# Patient Record
Sex: Female | Born: 1975 | Race: Black or African American | Hispanic: No | Marital: Single
Health system: Southern US, Community
[De-identification: ages and names within clinical notes are randomized; demographics above are authoritative.]

---

## 2011-02-25 ENCOUNTER — Emergency Department: Payer: Self-pay | Admitting: Emergency Medicine

## 2011-04-29 ENCOUNTER — Emergency Department: Payer: Self-pay | Admitting: *Deleted

## 2011-05-10 ENCOUNTER — Emergency Department: Payer: Self-pay | Admitting: Emergency Medicine

## 2011-09-05 ENCOUNTER — Emergency Department: Payer: Self-pay | Admitting: Emergency Medicine

## 2011-09-05 LAB — CBC
HGB: 13.7 g/dL (ref 12.0–16.0)
MCH: 30 pg (ref 26.0–34.0)
MCHC: 33.8 g/dL (ref 32.0–36.0)
Platelet: 236 10*3/uL (ref 150–440)
RBC: 4.57 10*6/uL (ref 3.80–5.20)
RDW: 13.8 % (ref 11.5–14.5)

## 2011-09-05 LAB — COMPREHENSIVE METABOLIC PANEL
Alkaline Phosphatase: 66 U/L (ref 50–136)
BUN: 13 mg/dL (ref 7–18)
Bilirubin,Total: 0.3 mg/dL (ref 0.2–1.0)
Chloride: 106 mmol/L (ref 98–107)
Creatinine: 0.82 mg/dL (ref 0.60–1.30)
EGFR (African American): >60
EGFR (Non-African Amer.): >60
SGOT(AST): 21 U/L (ref 15–37)
SGPT (ALT): 26 U/L
Sodium: 143 mmol/L (ref 136–145)
Total Protein: 7.6 g/dL (ref 6.4–8.2)

## 2011-09-05 LAB — TROPONIN I: Troponin-I: <0.02 ng/mL

## 2012-01-24 ENCOUNTER — Ambulatory Visit: Payer: Self-pay | Admitting: Obstetrics and Gynecology

## 2012-01-24 LAB — BASIC METABOLIC PANEL
BUN: 9 mg/dL (ref 7–18)
Calcium, Total: 8.9 mg/dL (ref 8.5–10.1)
Chloride: 107 mmol/L (ref 98–107)
Co2: 28 mmol/L (ref 21–32)
Creatinine: 0.7 mg/dL (ref 0.60–1.30)
EGFR (African American): >60
EGFR (Non-African Amer.): >60
Glucose: 64 mg/dL — ABNORMAL LOW (ref 65–99)
Potassium: 3.9 mmol/L (ref 3.5–5.1)
Sodium: 142 mmol/L (ref 136–145)

## 2012-01-24 LAB — CBC
HCT: 38.5 % (ref 35.0–47.0)
MCV: 90 fL (ref 80–100)
RDW: 14.7 % — ABNORMAL HIGH (ref 11.5–14.5)

## 2012-02-01 ENCOUNTER — Ambulatory Visit: Payer: Self-pay | Admitting: Obstetrics and Gynecology

## 2012-02-02 LAB — HEMATOCRIT: HCT: 31.7 % — ABNORMAL LOW (ref 35.0–47.0)

## 2012-02-07 ENCOUNTER — Emergency Department: Payer: Self-pay | Admitting: *Deleted

## 2012-02-07 LAB — CBC
HGB: 10.9 g/dL — ABNORMAL LOW (ref 12.0–16.0)
MCH: 28.4 pg (ref 26.0–34.0)
MCHC: 32.2 g/dL (ref 32.0–36.0)
MCV: 88 fL (ref 80–100)
Platelet: 343 10*3/uL (ref 150–440)
RDW: 14 % (ref 11.5–14.5)

## 2012-02-07 LAB — COMPREHENSIVE METABOLIC PANEL
Albumin: 3.1 g/dL — ABNORMAL LOW (ref 3.4–5.0)
Alkaline Phosphatase: 69 U/L (ref 50–136)
Anion Gap: 8 (ref 7–16)
Calcium, Total: 9 mg/dL (ref 8.5–10.1)
Chloride: 104 mmol/L (ref 98–107)
Creatinine: 0.83 mg/dL (ref 0.60–1.30)
EGFR (African American): >60
Glucose: 84 mg/dL (ref 65–99)
Osmolality: 277 (ref 275–301)
Potassium: 3.8 mmol/L (ref 3.5–5.1)
SGOT(AST): 17 U/L (ref 15–37)
SGPT (ALT): 18 U/L
Sodium: 140 mmol/L (ref 136–145)
Total Protein: 7.8 g/dL (ref 6.4–8.2)

## 2012-02-07 LAB — URINALYSIS, COMPLETE
Bacteria: NONE SEEN
Bilirubin,UR: NEGATIVE
Blood: NEGATIVE
Glucose,UR: NEGATIVE mg/dL (ref 0–75)
Specific Gravity: 1.021 (ref 1.003–1.030)
Squamous Epithelial: 1
WBC UR: 1 /HPF (ref 0–5)
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
## 2013-11-18 IMAGING — CR DG CHEST 2V
1 series · 2 of 2 positions shown · non-contrast
Comparison: none

REASON FOR EXAM: chest pain
COMMENTS:   LMP: Three weeks ago

PROCEDURE:     DXR - DXR CHEST PA (OR AP) AND LATERAL  - September 05, 2011  [DATE]
RESULT:     The lungs are clear. The heart and pulmonary vessels are normal.
The bony and mediastinal structures are unremarkable. There is no effusion.
There is no pneumothorax or evidence of congestive failure.

[Series 1: w chest pa · 0.14mm/px · 2 of 2 slices shown]
[im 1/2]
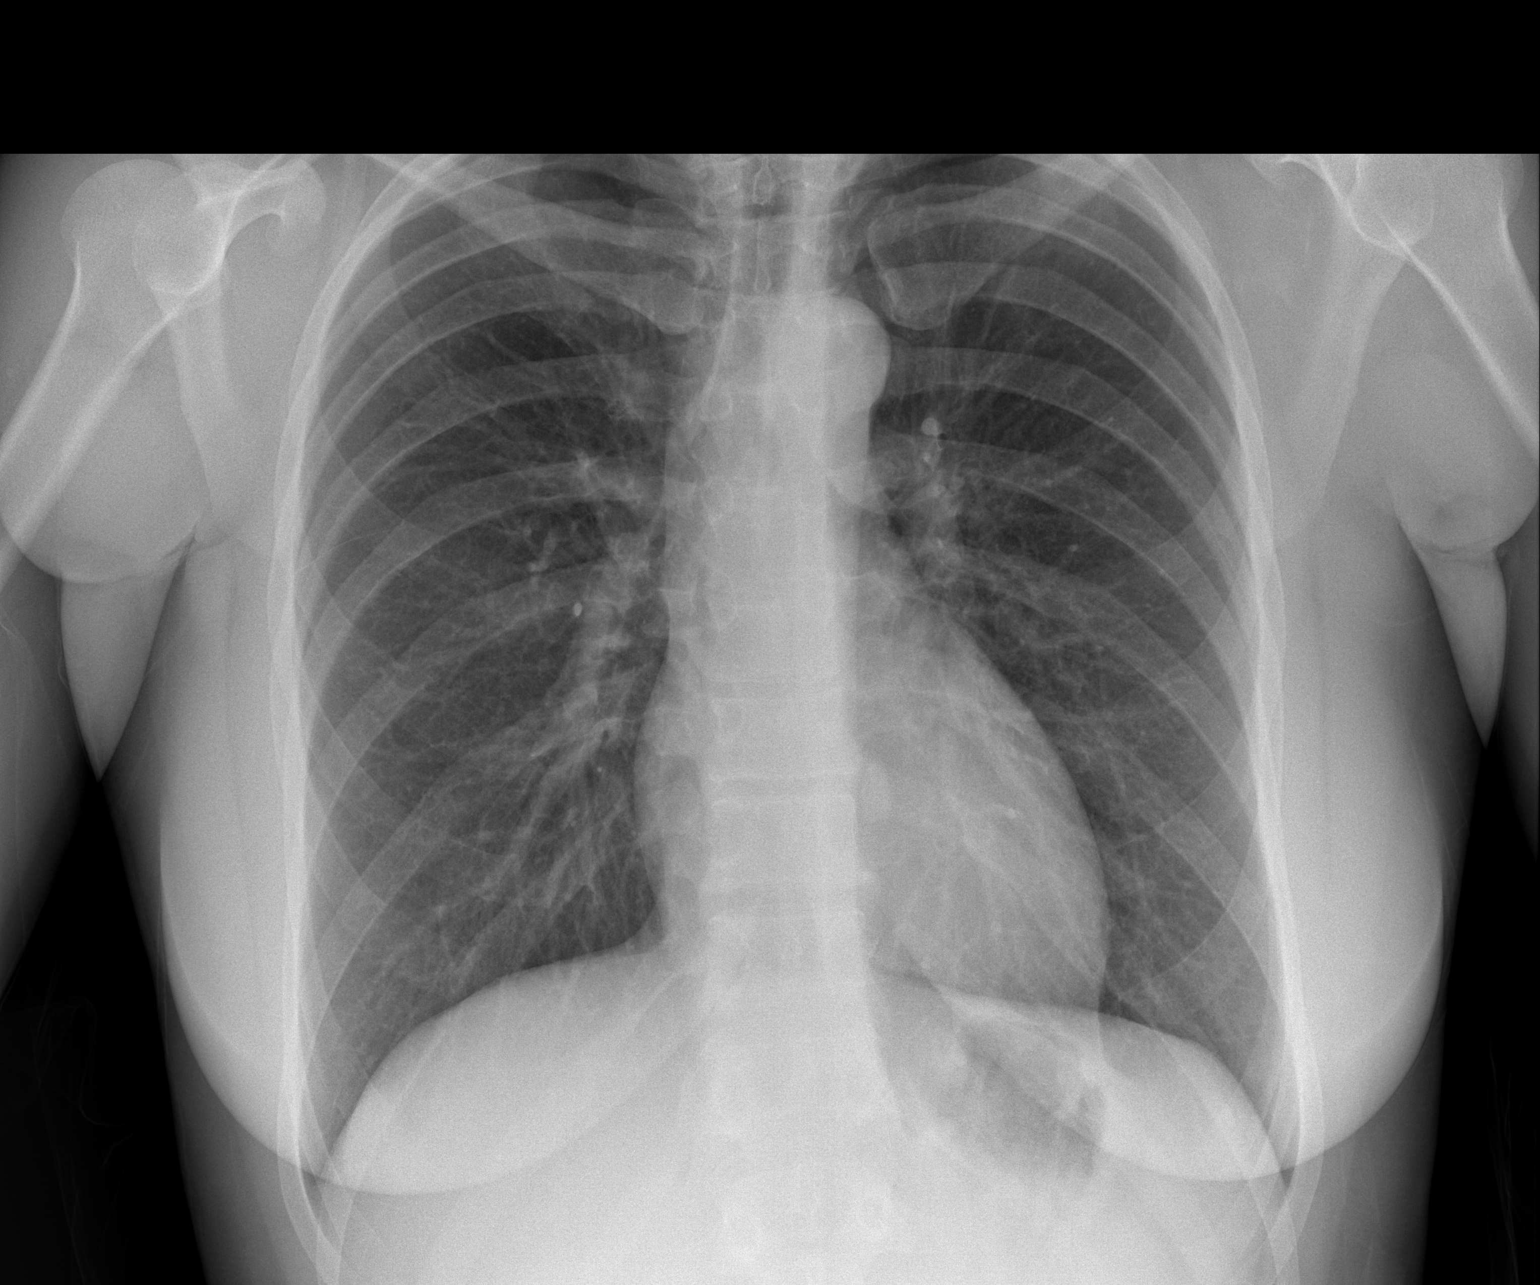
[im 2/2]
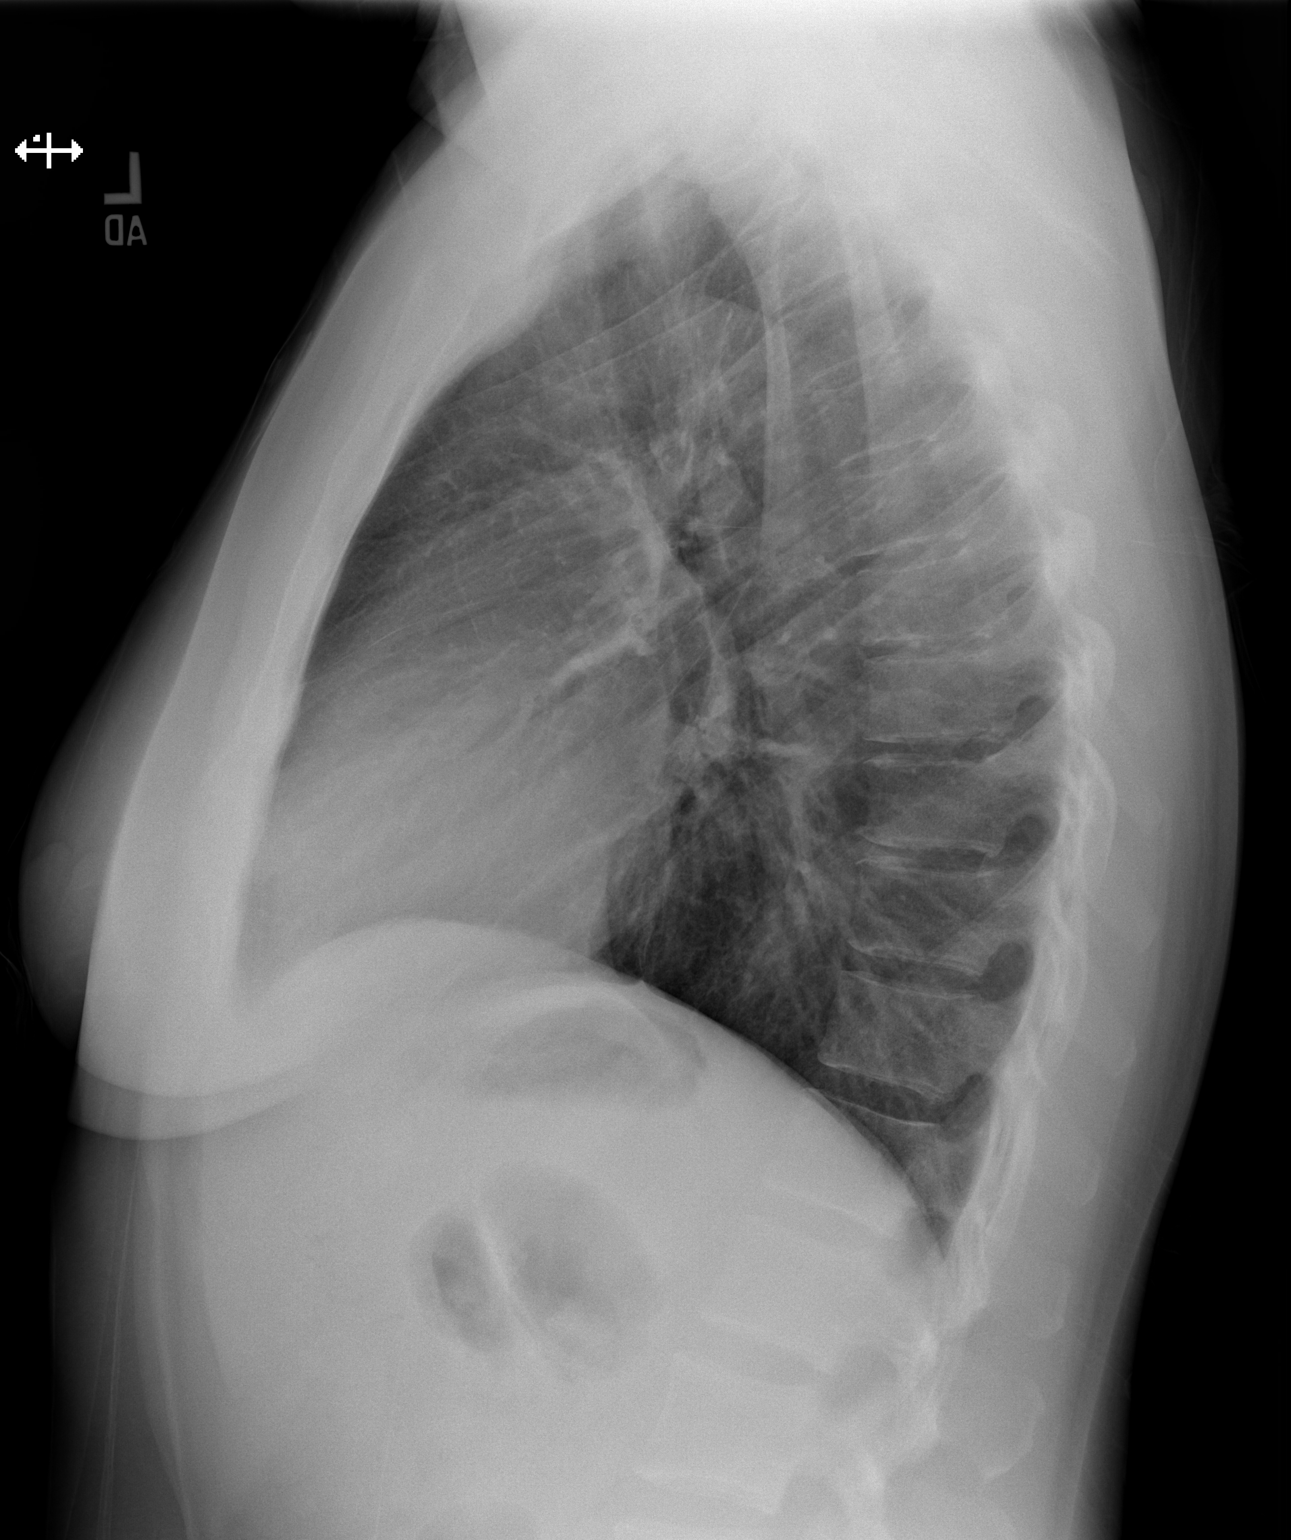

[2 of 2 positions shown; findings below may reference images not displayed]

IMPRESSION: No acute cardiopulmonary disease.

## 2014-12-01 NOTE — Op Note (Signed)
PATIENT NAME:  Terri Ball, Terri Ball MR#:  [id number] DATE OF BIRTH:  October 23, 1975  DATE OF PROCEDURE:  02/01/2012  PREOPERATIVE DIAGNOSES:  1. Severe pelvic pain. 2. Fibroid uterus. 3. Previous LEEP procedure.   POSTOPERATIVE DIAGNOSES: 1. Severe pelvic pain. 2. Fibroid uterus. 3. Previous LEEP procedure.  4. Severe anterior abdominal wall adhesions. 5. Lateral spread of fibroids.   PROCEDURES: 1. Total laparoscopic hysterectomy.  2. Bilateral salpingectomy.  3. Morcellation of uterus. 4. Lysis of severe abdominal adhesions in order to get to the surgical site.   SURGEON: Elliot Gurneyarrie C. Ichiro Chesnut, MD   ASSISTANT: Vena AustriaAndreas Staebler, MD   ESTIMATED BLOOD LOSS: 250 mL.   FINDINGS: Large globular very soft in place uterus with normal ovaries bilaterally and chandelier omental and adhesions to the anterior abdominal wall as well as normal cervix, morcellated uterus, bilateral tubes with scarring indicative of previous surgery, tubal ligation.   PROCEDURE: The patient was taken to the operating room and placed in supine position. After adequate general endotracheal anesthesia was instilled, the patient was prepped and draped in the usual sterile fashion. Time-out was performed. Side-opening speculum was placed in the patient's vagina. The anterior lip of the cervix was grasped with a single-tooth tenaculum and the V-care was placed on the cervix. The tenaculum was then removed and Foley catheter was placed. Attention was then turned to the umbilicus. The umbilicus was injected with Marcaine. Incision was made through the umbilicus. The patient was then cut with the scalpel and Veress needle was placed. Hanging drop test, fluid instillation test, and fluid aspiration test showed proper placement of the Veress needle. The CO2 was placed on low flow. When tympany was heard around the liver, CO2 was placed on high flow. The patient was then placed in Trendelenburg after the trocar was placed under direct  visualization into the abdomen. The aforementioned adhesions were seen and a trocar port was placed in the right lower quadrant that was visualized. Then the Harmonic scalpel was used to remove the adhesions and under direct visualization the left lower quadrant port was placed under direct visualization. Attention was then turned to the uterus which was doused in Marcaine. First the left, then the right tube was removed to the level of the uterus. Round ligament was grasped and cauterized with the Harmonic scalpel. The uteroovarian ligament was clamped and cut bilaterally. The peritoneum was dissected out towards the lower bladder. This was then done on the right side. The round ligament and the large and the uterine arteries were identified on the left and cauterized and cut across in a similar fashion. This was done on the left side. However, there were unusual adhesions that kept the uterus pulled to the right-hand side. The Harmonic scalpel was then used to cut around the cup of the V-care. The patient had requested nothing coming out of her vagina and given the patient's request wanted to be honored, the uterus was then morcellated on the inside of the abdomen. The abdomen was irrigated with copious amounts of warm saline. The EndoStitch was then used to sew across the cuff in a running fashion. A second stitch was placed in the left corner as the suture was not long enough for good coverage of the cuff. Pelvis was then copiously irrigated with warm normal saline while pieces of the uterus were removed that were identified. Intercede was placed across the cuff. The patient was then laid supine with blue urine noted in the Foley bag after having looked for the  ureters, identified them, and given indigo carmine prior to cutting across the ovarian arteries. The patient tolerated the procedure well and was taken to recovery.   ____________________________ Elliot Gurney, MD cck:drc D: 02/07/2012 00:26:42  ET T: 02/07/2012 09:23:38 ET JOB#: [id number]  cc: Elliot Gurney, MD, <Dictator> Elliot Gurney MD ELECTRONICALLY SIGNED 02/07/2012 17:13
